# Patient Record
Sex: Female | Born: 1980 | Race: Black or African American | Hispanic: No | Marital: Single | Smoking: Former smoker
Health system: Southern US, Community
[De-identification: ages and names within clinical notes are randomized; demographics above are authoritative.]

## PROBLEM LIST (undated history)

## (undated) DIAGNOSIS — Z7251 High risk heterosexual behavior: Secondary | ICD-10-CM

## (undated) DIAGNOSIS — R002 Palpitations: Secondary | ICD-10-CM

## (undated) DIAGNOSIS — F341 Dysthymic disorder: Secondary | ICD-10-CM

## (undated) DIAGNOSIS — G43909 Migraine, unspecified, not intractable, without status migrainosus: Secondary | ICD-10-CM

## (undated) DIAGNOSIS — G4700 Insomnia, unspecified: Secondary | ICD-10-CM

## (undated) DIAGNOSIS — Z1231 Encounter for screening mammogram for malignant neoplasm of breast: Secondary | ICD-10-CM

## (undated) DIAGNOSIS — E669 Obesity, unspecified: Secondary | ICD-10-CM

## (undated) DIAGNOSIS — D509 Iron deficiency anemia, unspecified: Secondary | ICD-10-CM

## (undated) DIAGNOSIS — F17200 Nicotine dependence, unspecified, uncomplicated: Secondary | ICD-10-CM

## (undated) DIAGNOSIS — R7309 Other abnormal glucose: Secondary | ICD-10-CM

## (undated) DIAGNOSIS — I10 Essential (primary) hypertension: Secondary | ICD-10-CM

## (undated) DIAGNOSIS — F419 Anxiety disorder, unspecified: Secondary | ICD-10-CM

## (undated) DIAGNOSIS — E785 Hyperlipidemia, unspecified: Secondary | ICD-10-CM

## (undated) HISTORY — DX: Anxiety disorder, unspecified: F41.9

## (undated) HISTORY — PX: OTHER SURGICAL HISTORY: SHX169

## (undated) HISTORY — DX: Obesity, unspecified: E66.9

## (undated) HISTORY — DX: Encounter for screening mammogram for malignant neoplasm of breast: Z12.31

## (undated) HISTORY — DX: Dysthymic disorder: F34.1

## (undated) HISTORY — DX: High risk heterosexual behavior: Z72.51

## (undated) HISTORY — DX: Hyperlipidemia, unspecified: E78.5

## (undated) HISTORY — DX: Iron deficiency anemia, unspecified: D50.9

## (undated) HISTORY — DX: Insomnia, unspecified: G47.00

## (undated) HISTORY — DX: Palpitations: R00.2

## (undated) HISTORY — DX: Nicotine dependence, unspecified, uncomplicated: F17.200

## (undated) HISTORY — DX: Other abnormal glucose: R73.09

## (undated) HISTORY — DX: Migraine, unspecified, not intractable, without status migrainosus: G43.909

## (undated) HISTORY — DX: Essential (primary) hypertension: I10

---
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
## 2006-10-21 ENCOUNTER — Ambulatory Visit: Payer: Self-pay | Admitting: Family Medicine

## 2006-10-26 ENCOUNTER — Ambulatory Visit: Payer: Self-pay | Admitting: Family Medicine

## 2007-12-20 LAB — HM MAMMOGRAPHY: HM Mammogram: NORMAL

## 2010-02-09 ENCOUNTER — Encounter: Payer: Self-pay | Admitting: Family Medicine

## 2010-10-13 ENCOUNTER — Ambulatory Visit: Payer: Self-pay | Admitting: Family Medicine

## 2011-10-01 ENCOUNTER — Encounter: Payer: Self-pay | Admitting: *Deleted

## 2011-10-05 ENCOUNTER — Encounter: Payer: Self-pay | Admitting: Family Medicine

## 2011-12-01 ENCOUNTER — Ambulatory Visit (INDEPENDENT_AMBULATORY_CARE_PROVIDER_SITE_OTHER): Payer: 59 | Admitting: Family Medicine

## 2011-12-01 ENCOUNTER — Encounter: Payer: Self-pay | Admitting: Family Medicine

## 2011-12-01 VITALS — BP 162/108 | HR 90 | Temp 98.5°F | Resp 16 | Ht 61.0 in | Wt 224.0 lb

## 2011-12-01 DIAGNOSIS — Z803 Family history of malignant neoplasm of breast: Secondary | ICD-10-CM

## 2011-12-01 DIAGNOSIS — Z0000 Encounter for general adult medical examination without abnormal findings: Secondary | ICD-10-CM

## 2011-12-01 LAB — POCT URINALYSIS DIPSTICK
Glucose, UA: NEGATIVE
Nitrite, UA: NEGATIVE
Protein, UA: NEGATIVE
Urobilinogen, UA: 0.2

## 2011-12-01 LAB — POCT UA - MICROSCOPIC ONLY: Bacteria, U Microscopic: NEGATIVE

## 2011-12-01 MED ORDER — TRAZODONE HCL 50 MG PO TABS: 50.0000 mg | ORAL_TABLET | Freq: Every evening | ORAL | Status: DC | PRN

## 2011-12-01 MED ORDER — SERTRALINE HCL 100 MG PO TABS: 100.0000 mg | ORAL_TABLET | Freq: Every day | ORAL | Status: DC

## 2011-12-01 MED ORDER — AMLODIPINE BESYLATE 10 MG PO TABS: 10.0000 mg | ORAL_TABLET | Freq: Every day | ORAL | Status: DC

## 2011-12-01 MED ORDER — HYDROCHLOROTHIAZIDE 25 MG PO TABS: 25.0000 mg | ORAL_TABLET | Freq: Every day | ORAL | Status: DC

## 2011-12-01 MED ORDER — METOPROLOL SUCCINATE ER 25 MG PO TB24: 25.0000 mg | ORAL_TABLET | Freq: Every day | ORAL | Status: DC

## 2011-12-01 NOTE — Progress Notes (Signed)
[address]   [place], [place]  [address]   [phone]  Subjective:    Patient ID: Sonia Rush, female    DOB: [birth year], 31 y.o.   MRN: [id number]  HPIThis 31 y.o. female presents to establish care and for CPE.  Last physical 2011.  Pap smear 2011.  Mammogram 2011[facility]; Mother with breast cancer age 65; MGM with breast cancer.  Colonoscopy never.  TDAP in past ten years.  Influenza vaccines never.  Eye exam 07/2011; +glasses.  Dental exam cleaning 06/2011.    HTN: did not continue Metoprolol as prescribed at last visit.  Depression/anxiety: well controlled with Zoloft.      Review of Systems  Constitutional: Negative for fever, chills, diaphoresis, activity change, appetite change, fatigue and unexpected weight change.  HENT: Negative for hearing loss, ear pain, nosebleeds, congestion, sore throat, facial swelling, rhinorrhea, sneezing, drooling, mouth sores, trouble swallowing, neck pain, neck stiffness, dental problem, voice change, postnasal drip, sinus pressure, tinnitus and ear discharge.   Eyes: Negative for photophobia, pain, discharge, redness, itching and visual disturbance.  Respiratory: Negative for apnea, cough, choking, chest tightness, shortness of breath, wheezing and stridor.   Cardiovascular: Negative for chest pain, palpitations and leg swelling.  Gastrointestinal: Negative for nausea, vomiting, abdominal pain, diarrhea, constipation, blood in stool, abdominal distention, anal bleeding and rectal pain.  Endocrine: Negative for cold intolerance, heat intolerance, polydipsia, polyphagia and polyuria.  Genitourinary: Negative for dysuria, urgency, frequency, hematuria, flank pain, decreased urine volume, vaginal bleeding, vaginal discharge, enuresis, difficulty urinating, genital sores, vaginal pain, menstrual problem, pelvic pain and dyspareunia.  Musculoskeletal: Negative for myalgias, back pain, joint swelling, arthralgias and gait problem.  Skin: Negative  for color change, pallor, rash and wound.  Allergic/Immunologic: Negative for environmental allergies, food allergies and immunocompromised state.  Neurological: Negative for dizziness, tremors, seizures, syncope, facial asymmetry, speech difficulty, weakness, light-headedness, numbness and headaches.  Hematological: Negative for adenopathy. Does not bruise/bleed easily.  Psychiatric/Behavioral: Negative for suicidal ideas, hallucinations, behavioral problems, confusion, sleep disturbance, self-injury, dysphoric mood, decreased concentration and agitation. The patient is not nervous/anxious and is not hyperactive.     Past Medical History  Diagnosis Date  . Obesity, unspecified   . Essential hypertension, benign   . Anxiety   . Insomnia, unspecified   . Palpitations   . Migraine, unspecified, without mention of intractable migraine without mention of status migrainosus   . Other and unspecified hyperlipidemia   . Screening mammogram for high-risk patient   . Other abnormal glucose   . Tobacco use disorder   . Dysthymic disorder   . Problems related to high-risk sexual behavior   . Iron deficiency anemia, unspecified     Past Surgical History  Procedure Laterality Date  . Therapuetic abortion      TAB  . Gastric bypass      Prior to Admission medications   Medication Sig Start Date End Date Taking? Authorizing Provider  amLODipine (NORVASC) 10 MG tablet Take 1 tablet (10 mg total) by mouth daily. 12/01/11  Yes Ethelda Chick, MD  hydrochlorothiazide (HYDRODIURIL) 25 MG tablet Take 1 tablet (25 mg total) by mouth daily. 12/01/11  Yes Ethelda Chick, MD  Multiple Vitamins-Minerals (MULTIVITAMIN PO) Take by mouth daily.   Yes Historical Provider, MD  sertraline (ZOLOFT) 100 MG tablet Take 1 tablet (100 mg total) by mouth daily. 12/01/11  Yes Ethelda Chick, MD  traZODone (DESYREL) 50 MG tablet Take 1 tablet (50 mg total) by mouth at  bedtime as needed. 12/01/11  Yes Ethelda Chick, MD   ferrous sulfate (QC FERROUS SULFATE) 325 (65 FE) MG tablet Take 325 mg by mouth daily with breakfast.    Historical Provider, MD  metoprolol succinate (TOPROL-XL) 25 MG 24 hr tablet Take 1 tablet (25 mg total) by mouth at bedtime. 12/01/11   Ethelda Chick, MD    No Known Allergies  History   Social History  . Marital Status: Single    Spouse Name: N/A    Number of Children: N/A  . Years of Education: college    Occupational History  . 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 Smoking status: Former Smoker -- 2 years    Types: Cigarettes  . Smokeless tobacco: Not on file     Comment: quit 2000  . Alcohol Use: No     Comment: weekends and one day per week usually  . Drug Use: No  . Sexually Active: Yes    Birth Control/ Protection: Condom   Other Topics Concern  . Not on file   Social History Narrative   Guns in home none unsecured.       Marital status:  Single; Dating same gentleman x 13 years;happy; no abuse.       Children: none but desires children.      Consumes a moderate amount of caffeine.       Lives with Sister, nephew.      Works: at [employer].      Smoke alarm and carbon monoxide detector in the home.       Exercise:3 times per week on XBox.      Always uses seat belts.      Sexual activity: total partners=30.  HSV (one outbreak every 4 months), Chlamydia.  Condoms 100%.    Family History  Problem Relation Age of Onset  . Hypertension Mother   . Diabetes Mother   . Cancer Mother 25    breast  . Stroke Father     x 3  . Coronary artery disease Father     stenting  . Hypertension Father   . Heart failure Father     CABG age 44  . Cancer Maternal Grandmother     Breast cancer  . Cancer Paternal Grandmother     Breast cancer  . Hypertension Sister        Objective:   Physical Exam  Nursing note and vitals reviewed. Constitutional: She is oriented to person, place, and time.  She appears well-developed and well-nourished. No distress.  HENT:  Head: Normocephalic and atraumatic.  Right Ear: External ear normal.  Left Ear: External ear normal.  Nose: Nose normal.  Mouth/Throat: Oropharynx is clear and moist.  Eyes: Conjunctivae and EOM are normal. Pupils are equal, round, and reactive to light.  Neck: Normal range of motion. Neck supple. No JVD present. No tracheal deviation present. No thyromegaly present.  Cardiovascular: Normal rate, regular rhythm, normal heart sounds and intact distal pulses.  Exam reveals no gallop and no friction rub.   No murmur heard. Pulmonary/Chest: Effort normal and breath sounds normal. No respiratory distress. She has no wheezes. She has no rales.  Abdominal: Soft. Bowel sounds are normal. She exhibits no distension and no mass. There is no tenderness. There is no rebound and no guarding. Hernia confirmed negative in the right inguinal area and confirmed negative in the left  inguinal area.  Genitourinary: Vagina normal and uterus normal. No breast swelling, tenderness, discharge or bleeding. There is no rash, tenderness or lesion on the right labia. There is no rash, tenderness or lesion on the left labia. Cervix exhibits no motion tenderness, no discharge and no friability. Right adnexum displays no mass, no tenderness and no fullness. Left adnexum displays no mass, no tenderness and no fullness. No vaginal discharge found.  Musculoskeletal:       Right shoulder: Normal.       Left shoulder: Normal.       Cervical back: Normal.  Lymphadenopathy:    She has no cervical adenopathy.       Right: No inguinal adenopathy present.       Left: No inguinal adenopathy present.  Neurological: She is alert and oriented to person, place, and time. She has normal reflexes. No cranial nerve deficit. She exhibits normal muscle tone. Coordination normal.  Skin: Skin is warm and dry. No rash noted. She is not diaphoretic.  Psychiatric: She has a normal  mood and affect. Her behavior is normal. Judgment and thought content normal.        Assessment & Plan:  Annual physical exam - Plan: EKG 12-Lead, POCT UA - Microscopic Only, POCT urinalysis dipstick  Family history of breast cancer in first degree relative - Plan: MM Digital Screening    1. CPE:  Anticipatory guidance ---- exercise, weight loss.  Pap smear obtained; refer for mammogram.  Obtain labs per [facility].  Immunizations UTD. 2. Gyn exam: completed; pap smear obtained; refer for mammogram. 3. Family hx of Breast cancer in first degree relative: refer for mammogram. 4.  HTN: uncontrolled; rx for Metoprolol provided. 5.  Depression with anxiety: controlled.    Meds ordered this encounter  Medications  . traZODone (DESYREL) 50 MG tablet    Sig: Take 1 tablet (50 mg total) by mouth at bedtime as needed.    Dispense:  90 tablet    Refill:  3  . sertraline (ZOLOFT) 100 MG tablet    Sig: Take 1 tablet (100 mg total) by mouth daily.    Dispense:  90 tablet    Refill:  1  . metoprolol succinate (TOPROL-XL) 25 MG 24 hr tablet    Sig: Take 1 tablet (25 mg total) by mouth at bedtime.    Dispense:  90 tablet    Refill:  1  . hydrochlorothiazide (HYDRODIURIL) 25 MG tablet    Sig: Take 1 tablet (25 mg total) by mouth daily.    Dispense:  90 tablet    Refill:  1  . amLODipine (NORVASC) 10 MG tablet    Sig: Take 1 tablet (10 mg total) by mouth daily.    Dispense:  90 tablet    Refill:  1

## 2011-12-01 NOTE — Patient Instructions (Addendum)
1. Annual physical exam  EKG 12-Lead, POCT UA - Microscopic Only, POCT urinalysis dipstick  2. Family history of breast cancer in first degree relative  MM Digital Screening

## 2012-01-20 HISTORY — PX: GASTRIC BYPASS: SHX52

## 2012-03-05 NOTE — Progress Notes (Signed)
Reviewed and agree.

## 2012-03-08 ENCOUNTER — Ambulatory Visit: Payer: 59 | Admitting: Family Medicine

## 2012-08-17 ENCOUNTER — Encounter: Payer: 59 | Admitting: Family Medicine

## 2013-08-23 ENCOUNTER — Encounter: Payer: 59 | Admitting: Family Medicine

## 2014-12-03 ENCOUNTER — Ambulatory Visit: Payer: Self-pay | Admitting: Family Medicine

## 2015-01-28 ENCOUNTER — Encounter: Payer: Self-pay | Admitting: Family Medicine

## 2015-12-10 ENCOUNTER — Ambulatory Visit: Payer: Self-pay | Admitting: Primary Care

## 2015-12-10 DIAGNOSIS — Z0289 Encounter for other administrative examinations: Secondary | ICD-10-CM

## 2016-02-14 ENCOUNTER — Ambulatory Visit: Payer: 59 | Admitting: Family Medicine

## 2016-12-16 ENCOUNTER — Encounter: Payer: Self-pay | Admitting: Physician Assistant

## 2016-12-16 ENCOUNTER — Ambulatory Visit (INDEPENDENT_AMBULATORY_CARE_PROVIDER_SITE_OTHER): Payer: 59 | Admitting: Physician Assistant

## 2016-12-16 VITALS — BP 144/80 | HR 79 | Temp 98.4°F | Resp 16 | Ht 61.0 in | Wt 163.0 lb

## 2016-12-16 DIAGNOSIS — Z9884 Bariatric surgery status: Secondary | ICD-10-CM

## 2016-12-16 DIAGNOSIS — I10 Essential (primary) hypertension: Secondary | ICD-10-CM

## 2016-12-16 DIAGNOSIS — Z7689 Persons encountering health services in other specified circumstances: Secondary | ICD-10-CM | POA: Diagnosis not present

## 2016-12-16 DIAGNOSIS — Z803 Family history of malignant neoplasm of breast: Secondary | ICD-10-CM

## 2016-12-16 DIAGNOSIS — Z8249 Family history of ischemic heart disease and other diseases of the circulatory system: Secondary | ICD-10-CM

## 2016-12-16 DIAGNOSIS — Z136 Encounter for screening for cardiovascular disorders: Secondary | ICD-10-CM

## 2016-12-16 DIAGNOSIS — R5383 Other fatigue: Secondary | ICD-10-CM | POA: Diagnosis not present

## 2016-12-16 DIAGNOSIS — R011 Cardiac murmur, unspecified: Secondary | ICD-10-CM

## 2016-12-16 DIAGNOSIS — Z800 Family history of malignant neoplasm of digestive organs: Secondary | ICD-10-CM | POA: Diagnosis not present

## 2016-12-16 DIAGNOSIS — F5101 Primary insomnia: Secondary | ICD-10-CM | POA: Diagnosis not present

## 2016-12-16 DIAGNOSIS — Z13220 Encounter for screening for lipoid disorders: Secondary | ICD-10-CM | POA: Diagnosis not present

## 2016-12-16 DIAGNOSIS — E559 Vitamin D deficiency, unspecified: Secondary | ICD-10-CM | POA: Diagnosis not present

## 2016-12-16 MED ORDER — AMLODIPINE BESYLATE 10 MG PO TABS: 10.0000 mg | ORAL_TABLET | Freq: Every day | ORAL | 1 refills | Status: AC

## 2016-12-16 MED ORDER — TRAZODONE HCL 50 MG PO TABS: 50.0000 mg | ORAL_TABLET | Freq: Every evening | ORAL | 3 refills | Status: AC | PRN

## 2016-12-16 NOTE — Patient Instructions (Signed)
DASH Eating Plan DASH stands for "Dietary Approaches to Stop Hypertension." The DASH eating plan is a healthy eating plan that has been shown to reduce high blood pressure (hypertension). It may also reduce your risk for type 2 diabetes, heart disease, and stroke. The DASH eating plan may also help with weight loss. What are tips for following this plan? General guidelines  Avoid eating more than 2,300 mg (milligrams) of salt (sodium) a day. If you have hypertension, you may need to reduce your sodium intake to 1,500 mg a day.  Limit alcohol intake to no more than 1 drink a day for nonpregnant women and 2 drinks a day for men. One drink equals 12 oz of beer, 5 oz of wine, or 1 oz of hard liquor.  Work with your health care provider to maintain a healthy body weight or to lose weight. Ask what an ideal weight is for you.  Get at least 30 minutes of exercise that causes your heart to beat faster (aerobic exercise) most days of the week. Activities may include walking, swimming, or biking.  Work with your health care provider or diet and nutrition specialist (dietitian) to adjust your eating plan to your individual calorie needs. Reading food labels  Check food labels for the amount of sodium per serving. Choose foods with less than 5 percent of the Daily Value of sodium. Generally, foods with less than 300 mg of sodium per serving fit into this eating plan.  To find whole grains, look for the word "whole" as the first word in the ingredient list. Shopping  Buy products labeled as "low-sodium" or "no salt added."  Buy fresh foods. Avoid canned foods and premade or frozen meals. Cooking  Avoid adding salt when cooking. Use salt-free seasonings or herbs instead of table salt or sea salt. Check with your health care provider or pharmacist before using salt substitutes.  Do not fry foods. Cook foods using healthy methods such as baking, boiling, grilling, and broiling instead.  Cook with  heart-healthy oils, such as olive, canola, soybean, or sunflower oil. Meal planning   Eat a balanced diet that includes: ? 5 or more servings of fruits and vegetables each day. At each meal, try to fill half of your plate with fruits and vegetables. ? Up to 6-8 servings of whole grains each day. ? Less than 6 oz of lean meat, poultry, or fish each day. A 3-oz serving of meat is about the same size as a deck of cards. One egg equals 1 oz. ? 2 servings of low-fat dairy each day. ? A serving of nuts, seeds, or beans 5 times each week. ? Heart-healthy fats. Healthy fats called Omega-3 fatty acids are found in foods such as flaxseeds and coldwater fish, like sardines, salmon, and mackerel.  Limit how much you eat of the following: ? Canned or prepackaged foods. ? Food that is high in trans fat, such as fried foods. ? Food that is high in saturated fat, such as fatty meat. ? Sweets, desserts, sugary drinks, and other foods with added sugar. ? Full-fat dairy products.  Do not salt foods before eating.  Try to eat at least 2 vegetarian meals each week.  Eat more home-cooked food and less restaurant, buffet, and fast food.  When eating at a restaurant, ask that your food be prepared with less salt or no salt, if possible. What foods are recommended? The items listed may not be a complete list. Talk with your dietitian about what   dietary choices are best for you. Grains Whole-grain or whole-wheat bread. Whole-grain or whole-wheat pasta. Brown rice. Oatmeal. Quinoa. Bulgur. Whole-grain and low-sodium cereals. Pita bread. Low-fat, low-sodium crackers. Whole-wheat flour tortillas. Vegetables Fresh or frozen vegetables (raw, steamed, roasted, or grilled). Low-sodium or reduced-sodium tomato and vegetable juice. Low-sodium or reduced-sodium tomato sauce and tomato paste. Low-sodium or reduced-sodium canned vegetables. Fruits All fresh, dried, or frozen fruit. Canned fruit in natural juice (without  added sugar). Meat and other protein foods Skinless chicken or turkey. Ground chicken or turkey. Pork with fat trimmed off. Fish and seafood. Egg whites. Dried beans, peas, or lentils. Unsalted nuts, nut butters, and seeds. Unsalted canned beans. Lean cuts of beef with fat trimmed off. Low-sodium, lean deli meat. Dairy Low-fat (1%) or fat-free (skim) milk. Fat-free, low-fat, or reduced-fat cheeses. Nonfat, low-sodium ricotta or cottage cheese. Low-fat or nonfat yogurt. Low-fat, low-sodium cheese. Fats and oils Soft margarine without trans fats. Vegetable oil. Low-fat, reduced-fat, or light mayonnaise and salad dressings (reduced-sodium). Canola, safflower, olive, soybean, and sunflower oils. Avocado. Seasoning and other foods Herbs. Spices. Seasoning mixes without salt. Unsalted popcorn and pretzels. Fat-free sweets. What foods are not recommended? The items listed may not be a complete list. Talk with your dietitian about what dietary choices are best for you. Grains Baked goods made with fat, such as croissants, muffins, or some breads. Dry pasta or rice meal packs. Vegetables Creamed or fried vegetables. Vegetables in a cheese sauce. Regular canned vegetables (not low-sodium or reduced-sodium). Regular canned tomato sauce and paste (not low-sodium or reduced-sodium). Regular tomato and vegetable juice (not low-sodium or reduced-sodium). Pickles. Olives. Fruits Canned fruit in a light or heavy syrup. Fried fruit. Fruit in cream or butter sauce. Meat and other protein foods Fatty cuts of meat. Ribs. Fried meat. Bacon. Sausage. Bologna and other processed lunch meats. Salami. Fatback. Hotdogs. Bratwurst. Salted nuts and seeds. Canned beans with added salt. Canned or smoked fish. Whole eggs or egg yolks. Chicken or turkey with skin. Dairy Whole or 2% milk, cream, and half-and-half. Whole or full-fat cream cheese. Whole-fat or sweetened yogurt. Full-fat cheese. Nondairy creamers. Whipped toppings.  Processed cheese and cheese spreads. Fats and oils Butter. Stick margarine. Lard. Shortening. Ghee. Bacon fat. Tropical oils, such as coconut, palm kernel, or palm oil. Seasoning and other foods Salted popcorn and pretzels. Onion salt, garlic salt, seasoned salt, table salt, and sea salt. Worcestershire sauce. Tartar sauce. Barbecue sauce. Teriyaki sauce. Soy sauce, including reduced-sodium. Steak sauce. Canned and packaged gravies. Fish sauce. Oyster sauce. Cocktail sauce. Horseradish that you find on the shelf. Ketchup. Mustard. Meat flavorings and tenderizers. Bouillon cubes. Hot sauce and Tabasco sauce. Premade or packaged marinades. Premade or packaged taco seasonings. Relishes. Regular salad dressings. Where to find more information:  National Heart, Lung, and Blood Institute: www.nhlbi.nih.gov  American Heart Association: www.heart.org Summary  The DASH eating plan is a healthy eating plan that has been shown to reduce high blood pressure (hypertension). It may also reduce your risk for type 2 diabetes, heart disease, and stroke.  With the DASH eating plan, you should limit salt (sodium) intake to 2,300 mg a day. If you have hypertension, you may need to reduce your sodium intake to 1,500 mg a day.  When on the DASH eating plan, aim to eat more fresh fruits and vegetables, whole grains, lean proteins, low-fat dairy, and heart-healthy fats.  Work with your health care provider or diet and nutrition specialist (dietitian) to adjust your eating plan to your individual   calorie needs. This information is not intended to replace advice given to you by your health care provider. Make sure you discuss any questions you have with your health care provider. Document Released: 12/25/2010 Document Revised: 12/30/2015 Document Reviewed: 12/30/2015 Elsevier Interactive Patient Education  2017 Else[employer]. Amlodipine tablets What is this medicine? AMLODIPINE (am LOE di peen) is a calcium-channel  blocker. It affects the amount of calcium found in your heart and muscle cells. This relaxes your blood vessels, which can reduce the amount of work the heart has to do. This medicine is used to lower high blood pressure. It is also used to prevent chest pain. This medicine may be used for other purposes; ask your health care provider or pharmacist if you have questions. COMMON BRAND NAME(S): Norvasc What should I tell my health care provider before I take this medicine? They need to know if you have any of these conditions: -heart problems like heart failure or aortic stenosis -liver disease -an unusual or allergic reaction to amlodipine, other medicines, foods, dyes, or preservatives -pregnant or trying to get pregnant -breast-feeding How should I use this medicine? Take this medicine by mouth with a glass of water. Follow the directions on the prescription label. Take your medicine at regular intervals. Do not take more medicine than directed. Talk to your pediatrician regarding the use of this medicine in children. Special care may be needed. This medicine has been used in children as young as 6. Persons over 37 years old may have a stronger reaction to this medicine and need smaller doses. Overdosage: If you think you have taken too much of this medicine contact a poison control center or emergency room at once. NOTE: This medicine is only for you. Do not share this medicine with others. What if I miss a dose? If you miss a dose, take it as soon as you can. If it is almost time for your next dose, take only that dose. Do not take double or extra doses. What may interact with this medicine? -herbal or dietary supplements -local or general anesthetics -medicines for high blood pressure -medicines for prostate problems -rifampin This list may not describe all possible interactions. Give your health care provider a list of all the medicines, herbs, non-prescription drugs, or dietary  supplements you use. Also tell them if you smoke, drink alcohol, or use illegal drugs. Some items may interact with your medicine. What should I watch for while using this medicine? Visit your doctor or health care professional for regular check ups. Check your blood pressure and pulse rate regularly. Ask your health care professional what your blood pressure and pulse rate should be, and when you should contact him or her. This medicine may make you feel confused, dizzy or lightheaded. Do not drive, use machinery, or do anything that needs mental alertness until you know how this medicine affects you. To reduce the risk of dizzy or fainting spells, do not sit or stand up quickly, especially if you are an older patient. Avoid alcoholic drinks; they can make you more dizzy. Do not suddenly stop taking amlodipine. Ask your doctor or health care professional how you can gradually reduce the dose. What side effects may I notice from receiving this medicine? Side effects that you should report to your doctor or health care professional as soon as possible: -allergic reactions like skin rash, itching or hives, swelling of the face, lips, or tongue -breathing problems -changes in vision or hearing -chest pain -fast, irregular heartbeat -  swelling of legs or ankles Side effects that usually do not require medical attention (report to your doctor or health care professional if they continue or are bothersome): -dry mouth -facial flushing -nausea, vomiting -stomach gas, pain -tired, weak -trouble sleeping This list may not describe all possible side effects. Call your doctor for medical advice about side effects. You may report side effects to FDA at 1-800-FDA-1088. Where should I keep my medicine? Keep out of the reach of children. Store at room temperature between 59 and 86 degrees F (15 and 30 degrees C). Protect from light. Keep container tightly closed. Throw away any unused medicine after the  expiration date. NOTE: This sheet is a summary. It may not cover all possible information. If you have questions about this medicine, talk to your doctor, pharmacist, or health care provider.  2018 Elsevier/Gold Standard (2011-12-04 11:40:58)

## 2016-12-16 NOTE — Progress Notes (Signed)
Patient: Sonia Rush Female    DOB: 1980-10-22   36 y.o.   MRN: [id number] Visit Date: 12/16/2016  Today's Provider: Margaretann LovelessJennifer M [name], PA-C   Chief Complaint  Patient presents with  . Establish Care   Subjective:    HPI Patient here today to establish care transferring from Sonia Chimeraristy Smith, MD. She has not been seen since 2013. Patient reports she has been off all of her medications since 2014.  Patient reports she has not been checking her bp at home, but does have history of HTN. She has previously been on amlodipine 10mg , metoprolol 25mg  and HCTZ 25mg . Patient reports headaches, denies chest pain or shortness of breath. Patient reports not following a low salt diet. Patient reports walking 30 minutes daily. Patient reports "unhealthy diet".  She does have strong family history of CAD in her father. He had CABG and 3 strokes for which he passed away from when he was 51.  She does have positive family history of breast cancer as well. Her mother was diagnosed at 6344 and passed at 7846. Her maternal grandmother was diagnosed with breast cancer in her 1370s, and is still living.   She also has family history of colon cancer in her paternal grandmother. She was diagnosed in her mid 1770s and passed from colon cancer.   Patient is going to see GYN Friday, 12/18/16 for pelvic and breast exam.     No Known Allergies   Current Outpatient Medications:  .  amLODipine (NORVASC) 10 MG tablet, Take 1 tablet (10 mg total) by mouth daily. (Patient not taking: Reported on 12/16/2016), Disp: 90 tablet, Rfl: 1 .  ferrous sulfate (QC FERROUS SULFATE) 325 (65 FE) MG tablet, Take 325 mg by mouth daily with breakfast., Disp: , Rfl:  .  hydrochlorothiazide (HYDRODIURIL) 25 MG tablet, Take 1 tablet (25 mg total) by mouth daily. (Patient not taking: Reported on 12/16/2016), Disp: 90 tablet, Rfl: 1 .  metoprolol succinate (TOPROL-XL) 25 MG 24 hr tablet, Take 1 tablet (25 mg total) by mouth at bedtime.  (Patient not taking: Reported on 12/16/2016), Disp: 90 tablet, Rfl: 1 .  Multiple Vitamins-Minerals (MULTIVITAMIN PO), Take by mouth daily., Disp: , Rfl:  .  sertraline (ZOLOFT) 100 MG tablet, Take 1 tablet (100 mg total) by mouth daily. (Patient not taking: Reported on 12/16/2016), Disp: 90 tablet, Rfl: 1 .  traZODone (DESYREL) 50 MG tablet, Take 1 tablet (50 mg total) by mouth at bedtime as needed. (Patient not taking: Reported on 12/16/2016), Disp: 90 tablet, Rfl: 3  Review of Systems  Constitutional: Positive for activity change, diaphoresis and fatigue.  Respiratory: Negative.   Cardiovascular: Negative.   Gastrointestinal: Positive for constipation.  Endocrine: Positive for cold intolerance.  Musculoskeletal: Negative.   Skin: Positive for rash.  Neurological: Positive for dizziness, light-headedness and headaches.  Psychiatric/Behavioral: The patient is nervous/anxious.     Social History   Tobacco Use  . Smoking status: Former Smoker    Years: 2.00    Types: Cigarettes  . Smokeless tobacco: Never Used  . Tobacco comment: quit 2000  Substance Use Topics  . Alcohol use: No    Comment: weekends and one day per week usually   Objective:   BP (!) 144/80 (BP Location: Left Arm, Patient Position: Sitting, Cuff Size: Large)   Pulse 79   Temp 98.4 F (36.9 C) (Oral)   Resp 16   Ht 5\' 1"  (1.549 m)   Wt 163 lb (73.9  kg)   LMP 12/09/2016 (Approximate)   BMI 30.80 kg/m  Vitals:   12/16/16 1035  BP: (!) 144/80  Pulse: 79  Resp: 16  Temp: 98.4 F (36.9 C)  TempSrc: Oral  Weight: 163 lb (73.9 kg)  Height: 5\' 1"  (1.549 m)     Physical Exam  Constitutional: She is oriented to person, place, and time. She appears well-developed and well-nourished. No distress.  HENT:  Head: Normocephalic and atraumatic.  Right Ear: External ear normal.  Left Ear: External ear normal.  Nose: Nose normal.  Mouth/Throat: Oropharynx is clear and moist. No oropharyngeal exudate.  Eyes:  Conjunctivae and EOM are normal. Pupils are equal, round, and reactive to light. Right eye exhibits no discharge. Left eye exhibits no discharge. No scleral icterus.  Neck: Normal range of motion. Neck supple. No JVD present. No tracheal deviation present. No thyromegaly present.  Cardiovascular: Normal rate, regular rhythm and intact distal pulses. Exam reveals no gallop and no friction rub.  Murmur heard.  Systolic murmur is present with a grade of 2/6. Pulmonary/Chest: Effort normal and breath sounds normal. No respiratory distress. She has no wheezes. She has no rales. She exhibits no tenderness.  Abdominal: Soft. Bowel sounds are normal. She exhibits no distension and no mass. There is no tenderness. There is no rebound and no guarding.  Musculoskeletal: Normal range of motion. She exhibits no edema or tenderness.  Lymphadenopathy:    She has no cervical adenopathy.  Neurological: She is alert and oriented to person, place, and time.  Skin: Skin is warm and dry. No rash noted. She is not diaphoretic.  Psychiatric: She has a normal mood and affect. Her behavior is normal. Judgment and thought content normal.  Vitals reviewed.       Assessment & Plan:     1. Establishing care with new doctor, encounter for Not seen by Primary since 2013. Previously followed by Sonia Rush.  2. Essential hypertension Elevated today with known history. Also strong family history of early CAD in father. Will restart amlodipine as below. Will check labs as below and f/u pending results. I will see her back in 4 weeks for BP recheck.  - amLODipine (NORVASC) 10 MG tablet; Take 1 tablet (10 mg total) by mouth daily.  Dispense: 90 tablet; Refill: 1 - CBC w/Diff/Platelet - TSH - Lipid Profile - HgB A1c - Comprehensive Metabolic Panel (CMET)  3. Primary insomnia Has been using OTC sleep aids unsuccessfully. Will restart Trazodone as below.  - traZODone (DESYREL) 50 MG tablet; Take 1 tablet (50 mg total) by  mouth at bedtime as needed.  Dispense: 90 tablet; Refill: 3  4. Heart murmur Newly found. No previous murmur documented. Will refer to cardiology for further evaluation due to family history.  - Comprehensive Metabolic Panel (CMET) - Ambulatory referral to Cardiology  5. Fatigue, unspecified type Worsening. Could be stress from school and work. Patient has had gastric bypass and may not be absorbing vitamins well. Will check labs as below and f/u pending results. - CBC w/Diff/Platelet - TSH - Vitamin D (25 hydroxy) - B12 - Comprehensive Metabolic Panel (CMET)  6. Encounter for lipid screening for cardiovascular disease H/O CAD in father. Patient with HTN. Will check labs as below and f/u pending results. - Lipid Profile  7. Family history of breast cancer in mother Mother passed at 4946, diagnosed at 5144. Maternal grandmother diagnosed in 1570s and still living.   8. Family history of early CAD Father passed at 7251  due to strokes, had CABG prior. - Lipid Profile - HgB A1c - Ambulatory referral to Cardiology  9. Family history of colon cancer Paternal grandmother in her 70s. This was cause of death.   10. S/P gastric bypass Will check labs as below and f/u pending results. - CBC w/Diff/Platelet - TSH - Lipid Profile - HgB A1c - Vitamin D (25 hydroxy) - B12       Sonia Loveless, PA-C  [facility]

## 2016-12-17 ENCOUNTER — Telehealth: Payer: Self-pay

## 2016-12-17 LAB — COMPREHENSIVE METABOLIC PANEL
ALK PHOS: 77 IU/L (ref 39–117)
ALT: 9 IU/L (ref 0–32)
AST: 22 IU/L (ref 0–40)
Albumin/Globulin Ratio: 1.4 (ref 1.2–2.2)
Albumin: 4.2 g/dL (ref 3.5–5.5)
BUN/Creatinine Ratio: 12 (ref 9–23)
BUN: 7 mg/dL (ref 6–20)
Bilirubin Total: <0.2 mg/dL (ref 0.0–1.2)
CALCIUM: 9.2 mg/dL (ref 8.7–10.2)
CO2: 20 mmol/L (ref 20–29)
CREATININE: 0.58 mg/dL (ref 0.57–1.00)
Chloride: 108 mmol/L — ABNORMAL HIGH (ref 96–106)
GFR calc Af Amer: 136 mL/min/{1.73_m2} (ref >59)
GFR calc non Af Amer: 118 mL/min/{1.73_m2} (ref >59)
GLUCOSE: 94 mg/dL (ref 65–99)
Globulin, Total: 2.9 g/dL (ref 1.5–4.5)
Potassium: 4.9 mmol/L (ref 3.5–5.2)
Sodium: 141 mmol/L (ref 134–144)
Total Protein: 7.1 g/dL (ref 6.0–8.5)

## 2016-12-17 LAB — CBC WITH DIFFERENTIAL/PLATELET
BASOS ABS: 0.1 10*3/uL (ref 0.0–0.2)
Basos: 1 %
EOS (ABSOLUTE): 0.1 10*3/uL (ref 0.0–0.4)
Eos: 1 %
HEMATOCRIT: 17.9 % — AB (ref 34.0–46.6)
Hemoglobin: 4.2 g/dL — CL (ref 11.1–15.9)
IMMATURE GRANS (ABS): 0 10*3/uL (ref 0.0–0.1)
IMMATURE GRANULOCYTES: 0 %
Lymphocytes Absolute: 1.2 10*3/uL (ref 0.7–3.1)
Lymphs: 17 %
MCH: 13.5 pg — ABNORMAL LOW (ref 26.6–33.0)
MCHC: 23.5 g/dL — CL (ref 31.5–35.7)
MCV: 58 fL — ABNORMAL LOW (ref 79–97)
MONOCYTES: 7 %
Monocytes Absolute: 0.5 10*3/uL (ref 0.1–0.9)
Neutrophils Absolute: 5.3 10*3/uL (ref 1.4–7.0)
Neutrophils: 74 %
PLATELETS: 705 10*3/uL — AB (ref 150–379)
RBC: 3.11 x10E6/uL — AB (ref 3.77–5.28)
RDW: 25.1 % — ABNORMAL HIGH (ref 12.3–15.4)
WBC: 7.1 10*3/uL (ref 3.4–10.8)

## 2016-12-17 LAB — LIPID PANEL
CHOLESTEROL TOTAL: 122 mg/dL (ref 100–199)
Chol/HDL Ratio: 2.3 ratio (ref 0.0–4.4)
HDL: 53 mg/dL (ref >39)
LDL Calculated: 57 mg/dL (ref 0–99)
TRIGLYCERIDES: 59 mg/dL (ref 0–149)
VLDL Cholesterol Cal: 12 mg/dL (ref 5–40)

## 2016-12-17 LAB — HEMOGLOBIN A1C
Est. average glucose Bld gHb Est-mCnc: 97 mg/dL
Hgb A1c MFr Bld: 5 % (ref 4.8–5.6)

## 2016-12-17 LAB — VITAMIN B12: VITAMIN B 12: 248 pg/mL (ref 232–1245)

## 2016-12-17 LAB — VITAMIN D 25 HYDROXY (VIT D DEFICIENCY, FRACTURES): VIT D 25 HYDROXY: 4.4 ng/mL — AB (ref 30.0–100.0)

## 2016-12-17 LAB — TSH: TSH: 1.53 u[IU]/mL (ref 0.450–4.500)

## 2016-12-17 NOTE — Telephone Encounter (Signed)
Patient has been called and she is on her way tot he ER.  She asked if you would call and let them know what is going on and what she needed to have done.

## 2016-12-17 NOTE — Telephone Encounter (Signed)
Please call patient and have her go to ER for repeat labs and possible blood transfusion. Hemoglobin is at 4.2 which is critically low.

## 2016-12-17 NOTE — Telephone Encounter (Signed)
Sonia Rush with [facility] called to report critical lab results from lab draw on 12/16/16.  Hgb- 4.2 Hematocrit- 17.9 MCHC- 23.5  Results reported to [place].

## 2016-12-18 ENCOUNTER — Encounter: Payer: Self-pay | Admitting: Advanced Practice Midwife

## 2016-12-18 ENCOUNTER — Telehealth: Payer: Self-pay

## 2016-12-18 ENCOUNTER — Ambulatory Visit (INDEPENDENT_AMBULATORY_CARE_PROVIDER_SITE_OTHER): Payer: 59 | Admitting: Advanced Practice Midwife

## 2016-12-18 VITALS — BP 150/90 | HR 78 | Ht 61.0 in | Wt 159.0 lb

## 2016-12-18 DIAGNOSIS — Z124 Encounter for screening for malignant neoplasm of cervix: Secondary | ICD-10-CM | POA: Diagnosis not present

## 2016-12-18 DIAGNOSIS — Z01419 Encounter for gynecological examination (general) (routine) without abnormal findings: Secondary | ICD-10-CM | POA: Diagnosis not present

## 2016-12-18 MED ORDER — VITAMIN D (ERGOCALCIFEROL) 1.25 MG (50000 UNIT) PO CAPS: 50000.0000 [IU] | ORAL_CAPSULE | ORAL | 1 refills | Status: AC

## 2016-12-18 NOTE — Telephone Encounter (Signed)
Pt is returning call.  CB#[phone]/MW

## 2016-12-18 NOTE — Telephone Encounter (Signed)
-----   Message from Margaretann LovelessJennifer M Burnette, [place] sent at 12/18/2016  9:30 AM EST ----- Can we see if patient went to ER for blood transfusion? I do not see any notes. It is important for her to get transfused with her hemoglobin being so low. Also her Vit D is low at 4.4. I will send in a Rx for Vit D high dose supplement for her to start. Also her B12 is low normal. I would recommend since she has had gastric bypass for her to also start an OTC B complex supplement.

## 2016-12-18 NOTE — Telephone Encounter (Signed)
LMTCB  Thanks,  -Sonia Rush 

## 2016-12-18 NOTE — Telephone Encounter (Signed)
Patient advised as directed below. Patient was also advised that if it was because of her Job that we can write a letter. She reports that she was going to talk to her Supervisor and head to the ER.  Thanks,  -Joseline

## 2016-12-18 NOTE — Addendum Note (Signed)
Addended by: Margaretann LovelessBURNETTE, Tonio Seider M on: 12/18/2016 09:32 AM   Modules accepted: Orders

## 2016-12-18 NOTE — Telephone Encounter (Signed)
Pt is returning call,  CB#[phone]/MW

## 2016-12-18 NOTE — Progress Notes (Signed)
Patient ID: Sonia Rush, female   DOB: Aug 31, 1980, 36 y.o.   MRN: [id number]     Gynecology Annual Exam  PCP: Margaretann LovelessBurnette, Jennifer M, PA-C  Chief Complaint:  Chief Complaint  Patient presents with  . Gynecologic Exam    set up for mammogram    History of Present Illness: Patient is a 36 y.o. G1P0010 presents for annual exam. The patient has complaints today of some cyclical breast tenderness. She also has c/o skin breakouts on her chest and abdomen that leave scars.    LMP: Patient's last menstrual period was 12/09/2016 (approximate). Average Interval: regular, 28 days Duration of flow: 5 days Heavy Menses: yes Clots: no Intermenstrual Bleeding: no Postcoital Bleeding: no Dysmenorrhea: no  The patient is sexually active with same sex partner. She currently uses none for contraception. She denies dyspareunia.  The patient does perform self breast exams.  There is notable family history of breast or ovarian cancer in her family. Her mother was diagnosed with breast cancer at age 37. She is undecided about genetic screening.   The patient wears seatbelts: yes.   The patient has regular exercise: yes.    The patient denies current symptoms of depression.    Review of Systems: Review of Systems  Constitutional: Negative.   HENT: Negative.   Eyes: Negative.   Respiratory: Negative.   Cardiovascular: Negative.   Gastrointestinal: Negative.   Genitourinary: Negative.   Musculoskeletal: Negative.   Skin: Negative.   Neurological: Negative.   Endo/Heme/Allergies: Negative.   Psychiatric/Behavioral: Negative.     Past Medical History:  Past Medical History:  Diagnosis Date  . Anxiety   . Dysthymic disorder   . Essential hypertension, benign   . Insomnia, unspecified   . Iron deficiency anemia, unspecified   . Migraine, unspecified, without mention of intractable migraine without mention of status migrainosus   . Obesity, unspecified   . Other abnormal glucose   .  Other and unspecified hyperlipidemia   . Palpitations   . Problems related to high-risk sexual behavior   . Screening mammogram for high-risk patient   . Tobacco use disorder     Past Surgical History:  Past Surgical History:  Procedure Laterality Date  . GASTRIC BYPASS  2014  . therapuetic abortion     TAB    Gynecologic History:  Patient's last menstrual period was 12/09/2016 (approximate). Contraception: none Last Pap: 3 years ago Results were: no abnormalities   Obstetric History: G1P0010  Family History:  Family History  Problem Relation Age of Onset  . Hypertension Mother   . Diabetes Mother   . Cancer Mother 5844       breast  . Stroke Father        x 3  . Coronary artery disease Father        stenting  . Hypertension Father   . Heart failure Father        CABG age 37  . Cancer Maternal Grandmother        Breast cancer  . Cancer Paternal Grandmother 6370       Colon cancer  . Hypertension Sister   . Cancer Paternal Aunt 352       colon    Social History:  Social History   Socioeconomic History  . Marital status: Single    Spouse name: Not on file  . Number of children: Not on file  . Years of education: college   . Highest education level: Not on file  Social  Needs  . Financial resource strain: Not on file  . Food insecurity - worry: Not on file  . Food insecurity - inability: Not on file  . Transportation needs - medical: Not on file  . Transportation needs - non-medical: Not on file  Occupational History  . 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 Smoking status: Former Smoker    Years: 2.00    Types: Cigarettes  . Smokeless tobacco: Never Used  . Tobacco comment: quit 2000  Substance and Sexual Activity  . Alcohol use: No    Comment: weekends and one day per week usually  . Drug use: No  . Sexual activity: Yes    Birth control/protection: Condom  Other Topics Concern  .  Not on file  Social History Narrative   Guns in home none unsecured.       Marital status:  Single; Dating same gentleman x 13 years;happy; no abuse.       Children: none but desires children.      Consumes a moderate amount of caffeine.       Lives with Sister, nephew.      Works: at [employer].      Smoke alarm and carbon monoxide detector in the home.       Exercise:3 times per week on XBox.      Always uses seat belts.      Sexual activity: total partners=30.  HSV (one outbreak every 4 months), Chlamydia.  Condoms 100%.    Allergies:  No Known Allergies  Medications: Prior to Admission medications   Medication Sig Start Date End Date Taking? Authorizing Provider  amLODipine (NORVASC) 10 MG tablet Take 1 tablet (10 mg total) by mouth daily. 12/16/16  Yes Margaretann Loveless, PA-C  traZODone (DESYREL) 50 MG tablet Take 1 tablet (50 mg total) by mouth at bedtime as needed. 12/16/16  Yes Margaretann Loveless, PA-C  Vitamin D, Ergocalciferol, (DRISDOL) 50000 units CAPS capsule Take 1 capsule (50,000 Units total) by mouth every 7 (seven) days. 12/18/16   Margaretann Loveless, PA-C    Physical Exam Vitals: Blood pressure (!) 150/90, pulse 78, height 5\' 1"  (1.549 m), weight 159 lb (72.1 kg), last menstrual period 12/09/2016.  General: NAD HEENT: normocephalic, anicteric Thyroid: no enlargement, no palpable nodules Pulmonary: No increased work of breathing, CTAB Cardiovascular: RRR, distal pulses 2+ Breast: Breast symmetrical, no tenderness, no palpable nodules or masses, no skin or nipple retraction present, no nipple discharge.  No axillary or supraclavicular lymphadenopathy. Extra skin noted on breasts due to weight loss. Not new per patient. Evidence of old skin lesions on breasts and abdomen are now dark areas on skin. No active lesions.  Abdomen: NABS, soft, non-tender, non-distended.  Umbilicus without lesions.  No hepatomegaly, splenomegaly or masses palpable.  No evidence of hernia  Genitourinary:  External: Normal external female genitalia.  Normal urethral meatus, normal  Bartholin's and Skene's glands.    Vagina: Normal vaginal mucosa, no evidence of prolapse.    Cervix: Grossly normal in appearance, no bleeding, friable, no CMT  Uterus: Non-enlarged, mobile, normal contour.    Adnexa: ovaries non-enlarged, no adnexal masses  Rectal: deferred  Lymphatic: no evidence of inguinal lymphadenopathy Extremities: no edema, erythema, or tenderness Neurologic: Grossly intact Psychiatric: mood appropriate, affect full   Assessment: 36 y.o. G1P0010 routine annual exam  Plan: Problem List Items Addressed This Visit  None      1) STI screening was offered and declined  2) ASCCP guidelines and rational discussed.  Patient opts for every 3 years screening interval  3) Contraception - none- same sex partner  4) Routine healthcare maintenance including cholesterol, diabetes screening discussed Declines   5) Increase healthy lifestyle diet, hydration, only use unscented, hypoallergenic, gentle body care and laundry products for skin care  5) Follow up 1 year for routine annual exam  Tresea MallJane Celsey Asselin, CNM

## 2016-12-21 LAB — IGP, APTIMA HPV
HPV Aptima: NEGATIVE
PAP SMEAR COMMENT: 0

## 2016-12-29 ENCOUNTER — Ambulatory Visit: Payer: 59 | Admitting: Nurse Practitioner

## 2016-12-30 ENCOUNTER — Telehealth: Payer: Self-pay | Admitting: Nurse Practitioner

## 2016-12-30 NOTE — Telephone Encounter (Signed)
lmov to rs 12/11 appt with berge due to weather

## 2017-01-13 ENCOUNTER — Ambulatory Visit: Payer: Self-pay | Admitting: Physician Assistant

## 2017-01-13 NOTE — Progress Notes (Deleted)
       Patient: Sonia OregonSheena S Rush Female    DOB: 03-21-1980   36 y.o.   MRN: [id number] Visit Date: 01/13/2017  Today's Provider: Margaretann LovelessJennifer M Burnette, PA-C   No chief complaint on file.  Subjective:    HPI  Hypertension, follow-up:  BP Readings from Last 3 Encounters:  12/18/16 (!) 150/90  12/16/16 (!) 144/80  12/01/11 (!) 162/108    She was last seen for hypertension 1 months ago.  BP at that visit was 150/90. Management changes since that visit include advised to restart Amlodipine 10 mg. She reports good compliance with treatment. She is not having side effects.  She {is/is not:9024} exercising. She {is/is not:9024} adherent to low salt diet.   Outside blood pressures are ***. She is experiencing none.  Patient denies chest pain, chest pressure/discomfort, claudication, dyspnea, exertional chest pressure/discomfort, fatigue, irregular heart beat, lower extremity edema, near-syncope, orthopnea, palpitations, paroxysmal nocturnal dyspnea, syncope and tachypnea.   Cardiovascular risk factors include obesity (BMI >= 30 kg/m2).  Use of agents associated with hypertension: none.     Weight trend: stable Wt Readings from Last 3 Encounters:  12/18/16 159 lb (72.1 kg)  12/16/16 163 lb (73.9 kg)  12/01/11 224 lb (101.6 kg)    Current diet: {diet habits:16563}  ------------------------------------------------------------------------     No Known Allergies   Current Outpatient Medications:  .  amLODipine (NORVASC) 10 MG tablet, Take 1 tablet (10 mg total) by mouth daily., Disp: 90 tablet, Rfl: 1 .  traZODone (DESYREL) 50 MG tablet, Take 1 tablet (50 mg total) by mouth at bedtime as needed., Disp: 90 tablet, Rfl: 3 .  Vitamin D, Ergocalciferol, (DRISDOL) 50000 units CAPS capsule, Take 1 capsule (50,000 Units total) by mouth every 7 (seven) days., Disp: 12 capsule, Rfl: 1  Review of Systems  Constitutional: Negative.   Respiratory: Negative.   Cardiovascular: Negative.      Social History   Tobacco Use  . Smoking status: Former Smoker    Years: 2.00    Types: Cigarettes  . Smokeless tobacco: Never Used  . Tobacco comment: quit 2000  Substance Use Topics  . Alcohol use: No    Comment: weekends and one day per week usually   Objective:   There were no vitals taken for this visit.   Physical Exam      Assessment & Plan:           Margaretann LovelessJennifer M Burnette, PA-C  [facility]

## 2017-01-14 ENCOUNTER — Ambulatory Visit
Admission: RE | Admit: 2017-01-14 | Discharge: 2017-01-14 | Disposition: A | Discharge status: Home / Self Care | Payer: 59 | Source: Ambulatory Visit | Attending: Advanced Practice Midwife | Admitting: Advanced Practice Midwife

## 2017-01-14 DIAGNOSIS — Z01419 Encounter for gynecological examination (general) (routine) without abnormal findings: Secondary | ICD-10-CM | POA: Diagnosis not present

## 2017-01-14 DIAGNOSIS — Z1231 Encounter for screening mammogram for malignant neoplasm of breast: Secondary | ICD-10-CM | POA: Insufficient documentation

## 2017-02-14 NOTE — Progress Notes (Deleted)
Cardiology Office Note  Date:  02/14/2017   ID:  Sonia Rush, DOB 1981/01/07, MRN [id number]  PCP:  Margaretann LovelessBurnette, Jennifer M, PA-C   No chief complaint on file.   HPI:   HTN Depression Insomnia, fatigue S/p gastric bypass Smoker, quit 2000 Fm hx of CAD, breast CA and colon ca Presenting by referral from Joycelyn ManJennifer Burnette for consultation of her heart murmur  HCT 17.9 HBG 4.2   PMH:   has a past medical history of Anxiety, Dysthymic disorder, Essential hypertension, benign, Insomnia, unspecified, Iron deficiency anemia, unspecified, Migraine, unspecified, without mention of intractable migraine without mention of status migrainosus, Obesity, unspecified, Other abnormal glucose, Other and unspecified hyperlipidemia, Palpitations, Problems related to high-risk sexual behavior, Screening mammogram for high-risk patient, and Tobacco use disorder.  PSH:    Past Surgical History:  Procedure Laterality Date  . GASTRIC BYPASS  2014  . therapuetic abortion     TAB    Current Outpatient Medications  Medication Sig Dispense Refill  . amLODipine (NORVASC) 10 MG tablet Take 1 tablet (10 mg total) by mouth daily. 90 tablet 1  . traZODone (DESYREL) 50 MG tablet Take 1 tablet (50 mg total) by mouth at bedtime as needed. 90 tablet 3  . Vitamin D, Ergocalciferol, (DRISDOL) 50000 units CAPS capsule Take 1 capsule (50,000 Units total) by mouth every 7 (seven) days. 12 capsule 1   No current facility-administered medications for this visit.      Allergies:   Patient has no known allergies.   Social History:  The patient  reports that she has quit smoking. Her smoking use included cigarettes. She quit after 2.00 years of use. she has never used smokeless tobacco. She reports that she does not drink alcohol or use drugs.   Family History:   family history includes Breast cancer in her maternal grandmother; Breast cancer (age of onset: 2944) in her mother; Cancer (age of onset: 11052) in her  paternal aunt; Cancer (age of onset: 570) in her paternal grandmother; Coronary artery disease in her father; Diabetes in her mother; Heart failure in her father; Hypertension in her father, mother, and sister; Stroke in her father.    Review of Systems: ROS   PHYSICAL EXAM: VS:  There were no vitals taken for this visit. , BMI There is no height or weight on file to calculate BMI. GEN: Well nourished, well developed, in no acute distress  HEENT: normal  Neck: no JVD, carotid bruits, or masses Cardiac: RRR; no murmurs, rubs, or gallops,no edema  Respiratory:  clear to auscultation bilaterally, normal work of breathing GI: soft, nontender, nondistended, + BS MS: no deformity or atrophy  Skin: warm and dry, no rash Neuro:  Strength and sensation are intact Psych: euthymic mood, full affect    Recent Labs: 12/16/2016: ALT 9; BUN 7; Creatinine, Ser 0.58; Hemoglobin 4.2; Platelets 705; Potassium 4.9; Sodium 141; TSH 1.530    Lipid Panel Lab Results  Component Value Date   CHOL 122 12/16/2016   HDL 53 12/16/2016   LDLCALC 57 12/16/2016   TRIG 59 12/16/2016      Wt Readings from Last 3 Encounters:  12/18/16 159 lb (72.1 kg)  12/16/16 163 lb (73.9 kg)  12/01/11 224 lb (101.6 kg)       ASSESSMENT AND PLAN:  No diagnosis found.   Disposition:   F/U  6 months  No orders of the defined types were placed in this encounter.    Lazarus SalinesSigned, Tim Gollan, M.D., Ph.D. 02/14/2017  [place], [place]

## 2017-02-16 ENCOUNTER — Ambulatory Visit: Payer: Self-pay | Admitting: Cardiovascular Disease

## 2017-02-17 ENCOUNTER — Encounter: Payer: Self-pay | Admitting: Cardiovascular Disease

## 2019-07-26 IMAGING — MG MM DIGITAL SCREENING BILAT W/ CAD
5 series · 5 of 5 positions shown · non-contrast
Comparison: Previous exam(s).

CLINICAL DATA: Screening.

EXAM:
DIGITAL SCREENING BILATERAL MAMMOGRAM WITH CAD

[R CC]
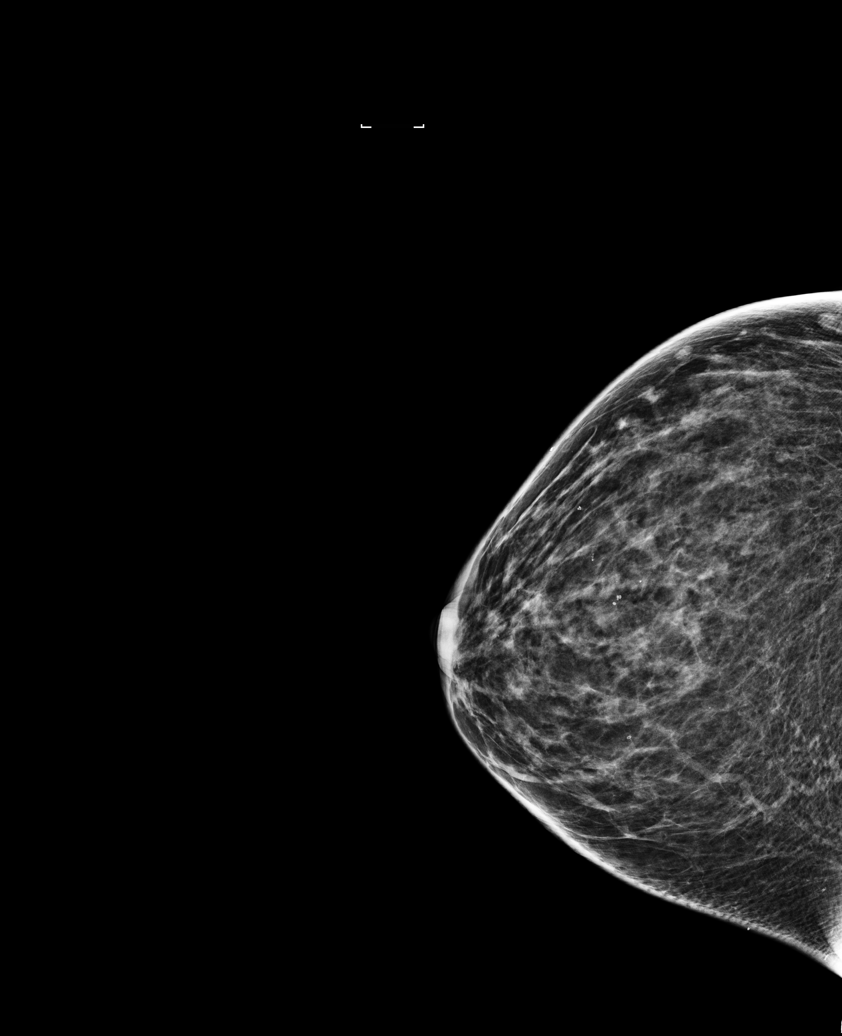

[L CC]
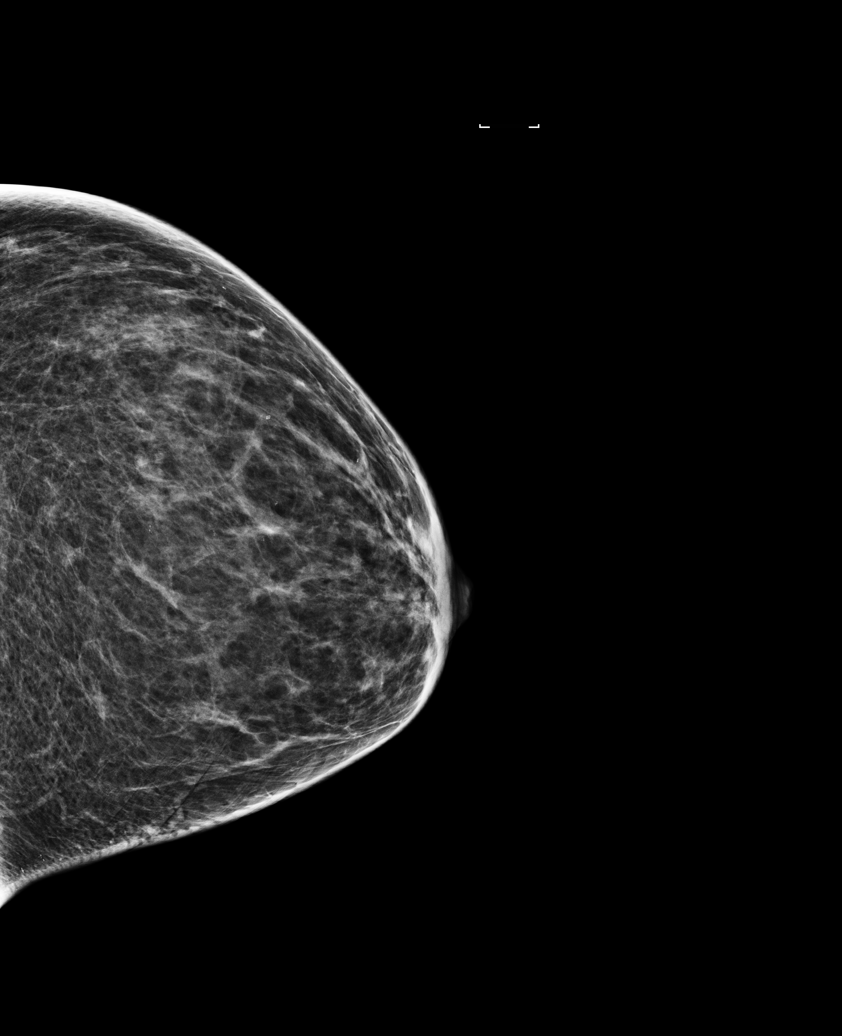

[R MLO]
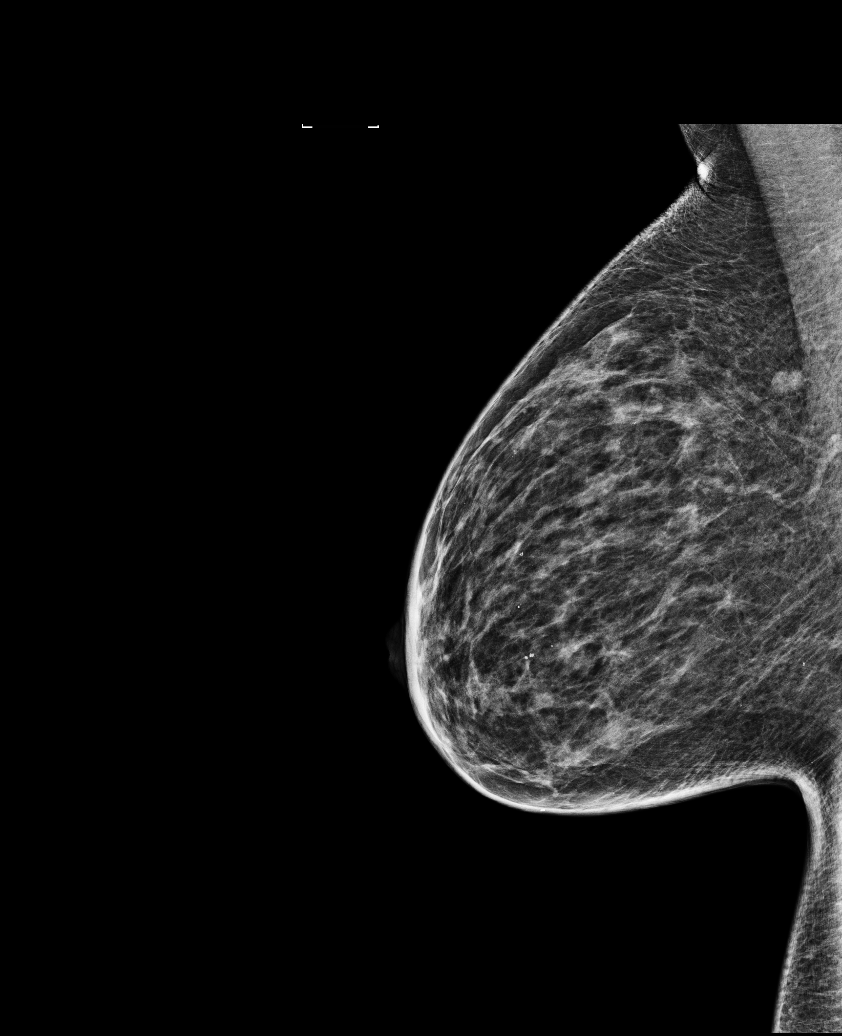

[L MLO (1 of 2)]
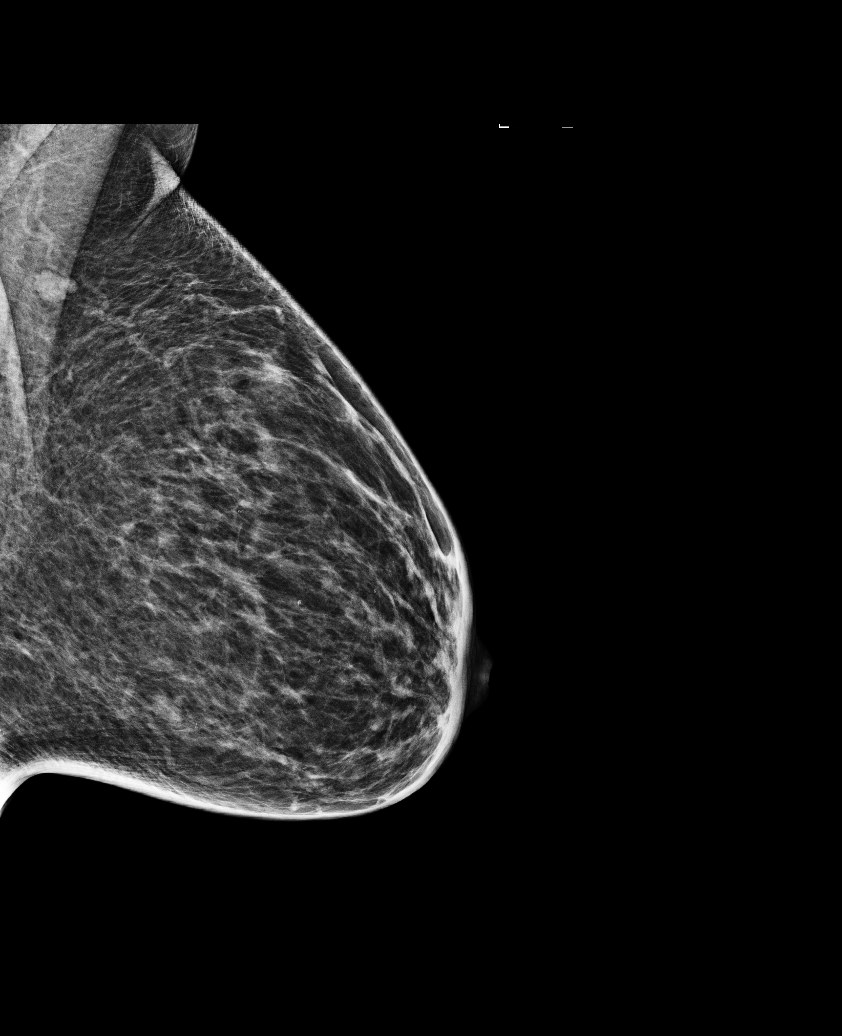

[L MLO (2 of 2)]
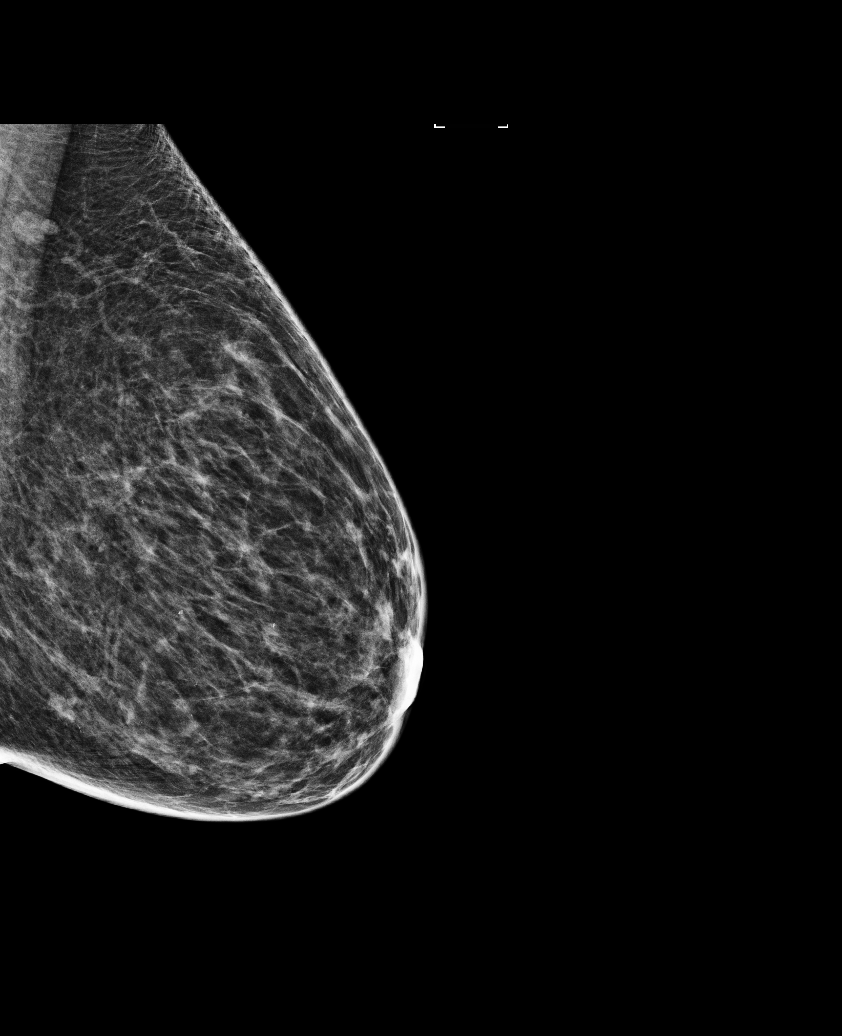

[5 of 5 positions shown; findings below may reference images not displayed]

ACR Breast Density Category b: There are scattered areas of
fibroglandular density.
FINDINGS: There are no findings suspicious for malignancy. Images were
processed with CAD.
IMPRESSION: No mammographic evidence of malignancy. A result letter of this
screening mammogram will be mailed directly to the patient.

RECOMMENDATION:
Screening mammogram at age 39. (Code:3H-V-U8B)

BI-RADS CATEGORY  1: Negative.
